# Patient Record
Sex: Female | Born: 1960 | Race: White | Hispanic: No | Marital: Single | Smoking: Current every day smoker
Health system: Southern US, Community
[De-identification: ages and names within clinical notes are randomized; demographics above are authoritative.]

## PROBLEM LIST (undated history)

## (undated) DIAGNOSIS — N281 Cyst of kidney, acquired: Secondary | ICD-10-CM

## (undated) DIAGNOSIS — E119 Type 2 diabetes mellitus without complications: Secondary | ICD-10-CM

## (undated) DIAGNOSIS — I10 Essential (primary) hypertension: Secondary | ICD-10-CM

## (undated) DIAGNOSIS — E785 Hyperlipidemia, unspecified: Secondary | ICD-10-CM

## (undated) HISTORY — DX: Cyst of kidney, acquired: N28.1

## (undated) HISTORY — DX: Type 2 diabetes mellitus without complications: E11.9

## (undated) HISTORY — DX: Hyperlipidemia, unspecified: E78.5

## (undated) HISTORY — DX: Essential (primary) hypertension: I10

## (undated) HISTORY — PX: ANKLE SURGERY: SHX546

---

## 2007-10-16 ENCOUNTER — Emergency Department (HOSPITAL_COMMUNITY): Admission: EM | Admit: 2007-10-16 | Discharge: 2007-10-17 | Payer: Self-pay | Admitting: Emergency Medicine

## 2008-03-25 ENCOUNTER — Emergency Department (HOSPITAL_BASED_OUTPATIENT_CLINIC_OR_DEPARTMENT_OTHER): Admission: EM | Admit: 2008-03-25 | Discharge: 2008-03-25 | Payer: Self-pay | Admitting: Emergency Medicine

## 2009-04-26 ENCOUNTER — Encounter: Admission: RE | Admit: 2009-04-26 | Discharge: 2009-04-26 | Payer: Self-pay | Admitting: Obstetrics & Gynecology

## 2009-10-02 ENCOUNTER — Ambulatory Visit: Payer: Self-pay | Admitting: Genetic Counselor

## 2011-03-25 LAB — DIFFERENTIAL
Basophils Absolute: 0
Eosinophils Relative: 2
Lymphs Abs: 4
Monocytes Absolute: 0.4
Monocytes Relative: 4
Neutro Abs: 5.1

## 2011-03-25 LAB — URINE MICROSCOPIC-ADD ON

## 2011-03-25 LAB — CBC
HCT: 39.1
Hemoglobin: 13.7
RDW: 12.1
WBC: 9.7

## 2011-03-25 LAB — URINALYSIS, ROUTINE W REFLEX MICROSCOPIC
Bilirubin Urine: NEGATIVE
Glucose, UA: NEGATIVE
Ketones, ur: NEGATIVE
Nitrite: NEGATIVE
Urobilinogen, UA: 0.2
pH: 6

## 2011-03-25 LAB — URINE CULTURE: Culture: NO GROWTH

## 2011-03-25 LAB — BASIC METABOLIC PANEL
CO2: 22
Calcium: 9.7
Chloride: 109
Creatinine, Ser: 0.66
Potassium: 3.6
Sodium: 141

## 2011-03-31 LAB — GLUCOSE, CAPILLARY

## 2011-04-24 ENCOUNTER — Other Ambulatory Visit: Payer: Self-pay | Admitting: Obstetrics & Gynecology

## 2011-04-24 DIAGNOSIS — Z1231 Encounter for screening mammogram for malignant neoplasm of breast: Secondary | ICD-10-CM

## 2011-05-19 ENCOUNTER — Ambulatory Visit
Admission: RE | Admit: 2011-05-19 | Discharge: 2011-05-19 | Disposition: A | Discharge status: Home / Self Care | Payer: BC Managed Care – PPO | Source: Ambulatory Visit | Attending: Obstetrics & Gynecology | Admitting: Obstetrics & Gynecology

## 2011-05-19 DIAGNOSIS — Z1231 Encounter for screening mammogram for malignant neoplasm of breast: Secondary | ICD-10-CM

## 2013-10-18 ENCOUNTER — Encounter: Payer: Self-pay | Admitting: Internal Medicine

## 2014-04-06 ENCOUNTER — Other Ambulatory Visit: Payer: Self-pay | Admitting: Internal Medicine

## 2014-04-06 ENCOUNTER — Encounter: Payer: Self-pay | Admitting: Internal Medicine

## 2014-04-06 DIAGNOSIS — Z1231 Encounter for screening mammogram for malignant neoplasm of breast: Secondary | ICD-10-CM

## 2014-04-10 ENCOUNTER — Telehealth: Payer: Self-pay

## 2014-04-10 NOTE — Telephone Encounter (Signed)
Left message with patient regarding appt with Dr. Delsa Sale on 12/28 at 2:50pm

## 2014-05-16 ENCOUNTER — Ambulatory Visit: Payer: Self-pay

## 2014-05-29 ENCOUNTER — Ambulatory Visit (AMBULATORY_SURGERY_CENTER): Payer: Self-pay | Admitting: *Deleted

## 2014-05-29 VITALS — Ht 68.5 in | Wt 209.0 lb

## 2014-05-29 DIAGNOSIS — Z800 Family history of malignant neoplasm of digestive organs: Secondary | ICD-10-CM

## 2014-05-29 MED ORDER — MOVIPREP 100 G PO SOLR: 1.0000 | Freq: Once | ORAL | Status: DC

## 2014-05-29 NOTE — Progress Notes (Signed)
No egg or soy allergy. ewm No diet pills. ewm No blood thinners. ewm No issues with past sedation. ewm No home 02 use. ewm No emmi video. ewm

## 2014-06-12 ENCOUNTER — Encounter: Payer: Self-pay | Admitting: Internal Medicine

## 2014-06-26 ENCOUNTER — Ambulatory Visit: Payer: BC Managed Care – PPO | Admitting: Obstetrics & Gynecology

## 2014-06-27 ENCOUNTER — Encounter: Payer: Self-pay | Admitting: Obstetrics & Gynecology

## 2014-07-06 ENCOUNTER — Telehealth: Payer: Self-pay | Admitting: Internal Medicine

## 2014-07-06 NOTE — Telephone Encounter (Signed)
Left message for pt to call back.  Pt states she is to take her last prednisone pill on Monday and wanted to know if she should take it or not. Instructed her to hold it until after procedure due to not having food on her stomach.

## 2014-07-10 ENCOUNTER — Encounter: Payer: Self-pay | Admitting: Internal Medicine

## 2014-07-10 ENCOUNTER — Ambulatory Visit (AMBULATORY_SURGERY_CENTER): Payer: BLUE CROSS/BLUE SHIELD | Admitting: Internal Medicine

## 2014-07-10 DIAGNOSIS — D125 Benign neoplasm of sigmoid colon: Secondary | ICD-10-CM

## 2014-07-10 DIAGNOSIS — D123 Benign neoplasm of transverse colon: Secondary | ICD-10-CM

## 2014-07-10 DIAGNOSIS — Z1211 Encounter for screening for malignant neoplasm of colon: Secondary | ICD-10-CM

## 2014-07-10 MED ORDER — SODIUM CHLORIDE 0.9 % IV SOLN: 500.0000 mL | INTRAVENOUS | Status: DC

## 2014-07-10 NOTE — Op Note (Signed)
[place]  [address] [place], [id number]   COLONOSCOPY PROCEDURE REPORT  PATIENT: Marcia Reyes, Marcia Reyes  MR#: [id number] BIRTHDATE: 26-Oct-1960 , 88  yrs. old GENDER: female ENDOSCOPIST: Jerene Bears, MD REFERRED XT:[id number] Philip Aspen, M.D. PROCEDURE DATE:  07/10/2014 PROCEDURE:   Colonoscopy with snare polypectomy First Screening Colonoscopy - Avg.  risk and is 50 yrs.  old or older Yes.  Prior Negative Screening - Now for repeat screening. N/A  History of Adenoma - Now for follow-up colonoscopy & has been > or = to 3 yrs.  N/A  Polyps Removed Today? Yes. ASA CLASS:   Class II INDICATIONS:average risk for colorectal cancer and first colonoscopy. MEDICATIONS: Monitored anesthesia care and Propofol 300 mg IV  DESCRIPTION OF PROCEDURE:   After the risks benefits and alternatives of the procedure were thoroughly explained, informed consent was obtained.  The digital rectal exam revealed external hemorrhoids.   The LB [id number]  endoscope was introduced through the anus and advanced to the cecum, which was identified by both the appendix and ileocecal valve. No adverse events experienced.   The quality of the prep was good, using MoviPrep The instrument was then slowly withdrawn as the colon was fully examined.   COLON FINDINGS: Three sessile polyps ranging from 4 to 12mm in size were found in the transverse colon and sigmoid colon. Polypectomies were performed with a cold snare.  The resection was complete, the polyp tissue was completely retrieved and sent to histology.   There was mild diverticulosis noted at the cecum and in the sigmoid colon.  Retroflexed views revealed internal hemorrhoids and Retroflexed views revealed external hemorrhoids. The time to cecum=3 minutes 42 seconds.  Withdrawal time=12 minutes 26 seconds.  The scope was withdrawn and the procedure completed. COMPLICATIONS: There were no immediate complications.  ENDOSCOPIC IMPRESSION: 1.    Three sessile polyps ranging from 4 to 67mm in size were found in the transverse colon and sigmoid colon; polypectomies were performed with a cold snare 2.   Mild diverticulosis was noted at the cecum and in the sigmoid colon  RECOMMENDATIONS: 1.  Await pathology results 2.  High fiber diet 3.  Timing of repeat colonoscopy will be determined by pathology findings. 4.  You will receive a letter within 1-2 weeks with the results of your biopsy as well as final recommendations.  Please call my office if you have not received a letter after 3 weeks.  eSigned:  Jerene Bears, MD 07/10/2014 3:30 PM  cc: Leanna Battles, MD and The Patient

## 2014-07-10 NOTE — Progress Notes (Signed)
Called to room to assist during endoscopic procedure.  Patient ID and intended procedure confirmed with present staff. Received instructions for my participation in the procedure from the performing physician.  

## 2014-07-10 NOTE — Patient Instructions (Signed)
YOU HAD AN ENDOSCOPIC PROCEDURE TODAY AT THE [facility]: Refer to the procedure report that was given to you for any specific questions about what was found during the examination.  If the procedure report does not answer your questions, please call your gastroenterologist to clarify.  If you requested that your care partner not be given the details of your procedure findings, then the procedure report has been included in a sealed envelope for you to review at your convenience later.  YOU SHOULD EXPECT: Some feelings of bloating in the abdomen. Passage of more gas than usual.  Walking can help get rid of the air that was put into your GI tract during the procedure and reduce the bloating. If you had a lower endoscopy (such as a colonoscopy or flexible sigmoidoscopy) you may notice spotting of blood in your stool or on the toilet paper. If you underwent a bowel prep for your procedure, then you may not have a normal bowel movement for a few days.  DIET: Your first meal following the procedure should be a light meal and then it is ok to progress to your normal diet.  A half-sandwich or bowl of soup is an example of a good first meal.  Heavy or fried foods are harder to digest and may make you feel nauseous or bloated.  Likewise meals heavy in dairy and vegetables can cause extra gas to form and this can also increase the bloating.  Drink plenty of fluids but you should avoid alcoholic beverages for 24 hours.  ACTIVITY: Your care partner should take you home directly after the procedure.  You should plan to take it easy, moving slowly for the rest of the day.  You can resume normal activity the day after the procedure however you should NOT DRIVE or use heavy machinery for 24 hours (because of the sedation medicines used during the test).    SYMPTOMS TO REPORT IMMEDIATELY: A gastroenterologist can be reached at any hour.  During normal business hours, 8:30 AM to 5:00 PM Monday through Friday,  call [phone].  After hours and on weekends, please call the GI answering service at [phone] who will take a message and have the physician on call contact you.   Following lower endoscopy (colonoscopy or flexible sigmoidoscopy):  Excessive amounts of blood in the stool  Significant tenderness or worsening of abdominal pains  Swelling of the abdomen that is new, acute  Fever of 100F or higher  FOLLOW UP: If any biopsies were taken you will be contacted by phone or by letter within the next 1-3 weeks.  Call your gastroenterologist if you have not heard about the biopsies in 3 weeks.  Our staff will call the home number listed on your records the next business day following your procedure to check on you and address any questions or concerns that you may have at that time regarding the information given to you following your procedure. This is a courtesy call and so if there is no answer at the home number and we have not heard from you through the emergency physician on call, we will assume that you have returned to your regular daily activities without incident.  SIGNATURES/CONFIDENTIALITY: You and/or your care partner have signed paperwork which will be entered into your electronic medical record.  These signatures attest to the fact that that the information above on your After Visit Summary has been reviewed and is understood.  Full responsibility of the confidentiality of this   discharge information lies with you and/or your care-partner.  Recommendations Next colonoscopy determined by pathology results. Polyp, diverticulosis, hemorrhoid, and high fiber diet handouts provided to patient/care partner.

## 2014-07-10 NOTE — Progress Notes (Signed)
Report to PACU, RN, vss, BBS= Clear.  

## 2014-07-11 ENCOUNTER — Telehealth: Payer: Self-pay | Admitting: *Deleted

## 2014-07-11 NOTE — Telephone Encounter (Signed)
  Follow up Call-  Call back number 07/10/2014  Post procedure Call Back phone  # [phone]  Permission to leave phone message Yes     Patient questions:  Do you have a fever, pain , or abdominal swelling? No. Pain Score  0 *  Have you tolerated food without any problems? Yes.    Have you been able to return to your normal activities? Yes.    Do you have any questions about your discharge instructions: Diet   No. Medications  No. Follow up visit  No.  Do you have questions or concerns about your Care? No.  Actions: * If pain score is 4 or above: No action needed, pain <4.

## 2014-07-20 ENCOUNTER — Encounter: Payer: Self-pay | Admitting: Internal Medicine

## 2019-06-21 ENCOUNTER — Encounter: Payer: Self-pay | Admitting: *Deleted

## 2020-04-06 ENCOUNTER — Other Ambulatory Visit: Payer: Self-pay | Admitting: Internal Medicine

## 2020-04-06 DIAGNOSIS — Z720 Tobacco use: Secondary | ICD-10-CM

## 2020-04-06 DIAGNOSIS — E1121 Type 2 diabetes mellitus with diabetic nephropathy: Secondary | ICD-10-CM

## 2020-04-25 ENCOUNTER — Ambulatory Visit
Admission: RE | Admit: 2020-04-25 | Discharge: 2020-04-25 | Disposition: A | Discharge status: Home / Self Care | Payer: No Typology Code available for payment source | Source: Ambulatory Visit | Attending: Internal Medicine | Admitting: Internal Medicine

## 2020-04-25 ENCOUNTER — Other Ambulatory Visit: Payer: Self-pay

## 2020-04-25 ENCOUNTER — Other Ambulatory Visit: Payer: Self-pay | Admitting: Internal Medicine

## 2020-04-25 DIAGNOSIS — E1121 Type 2 diabetes mellitus with diabetic nephropathy: Secondary | ICD-10-CM

## 2020-04-25 DIAGNOSIS — Z720 Tobacco use: Secondary | ICD-10-CM

## 2022-01-12 IMAGING — CT CT CHEST LUNG CANCER SCREENING LOW DOSE W/O CM
1 series · 14 of 33 positions shown, 18 images · non-contrast
Comparison: No priors.

CLINICAL DATA: 58-year-old female current smoker with 46 pack-year
history of smoking. Lung cancer screening examination.

EXAM:
CT CHEST WITHOUT CONTRAST LOW-DOSE FOR LUNG CANCER SCREENING
TECHNIQUE: Multidetector CT imaging of the chest was performed following the
standard protocol without IV contrast.

[Series 2: ldct screening <30 bmi · axial · 0.71mm/px · z∈[-324,-34]mm · 14 of 70 slices shown, 18 images]
[im 6/70  mediastinal]
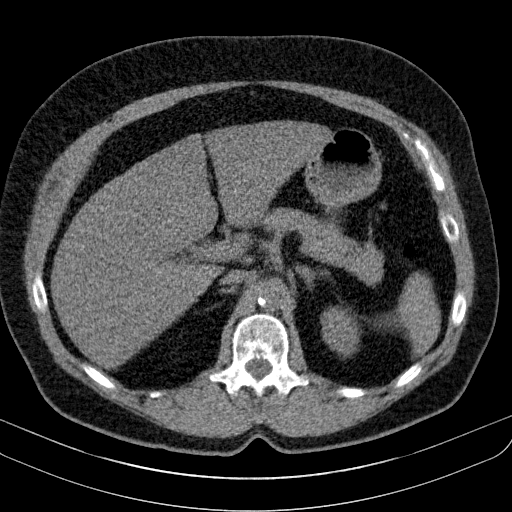
[im 6/70  lung]
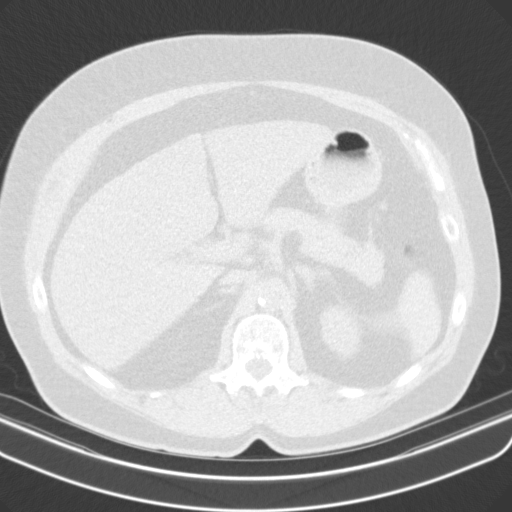
[im 11/70  lung]
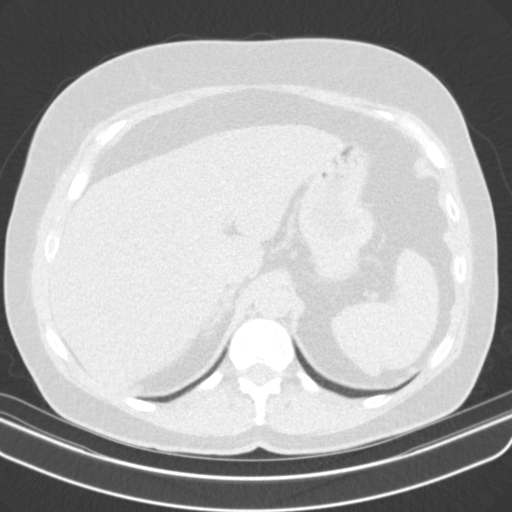
[im 14/70  lung]
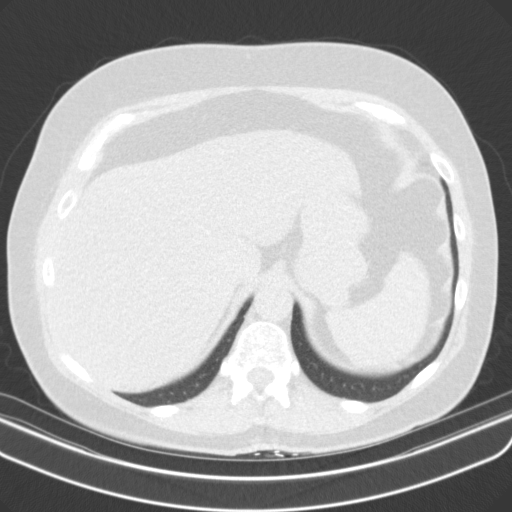
[im 18/70  lung]
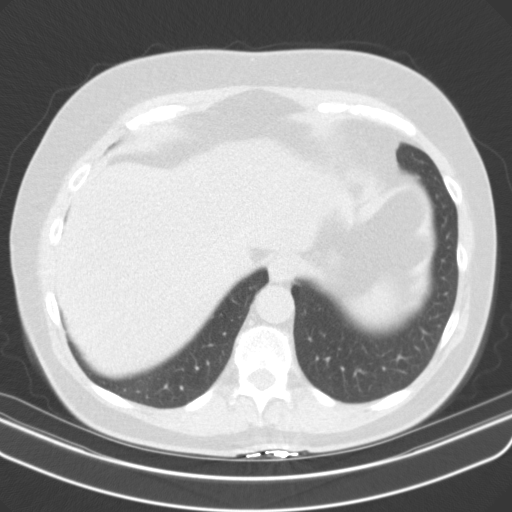
[im 24/70  mediastinal]
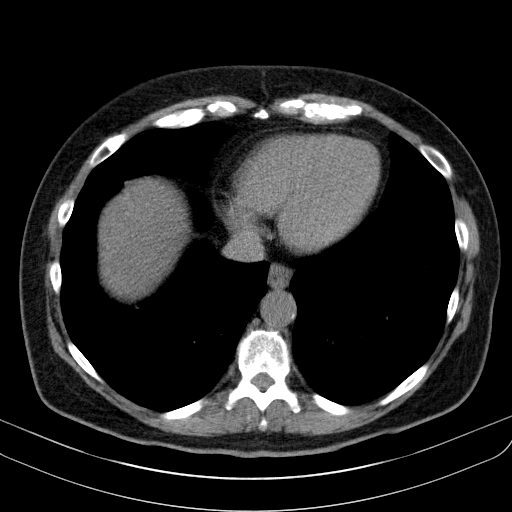
[im 24/70  lung]
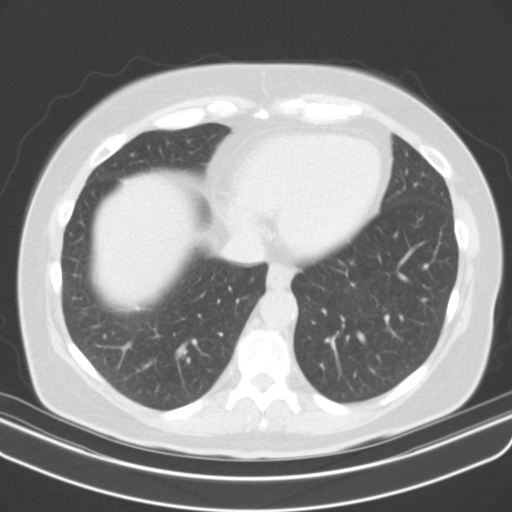
[im 29/70  lung]
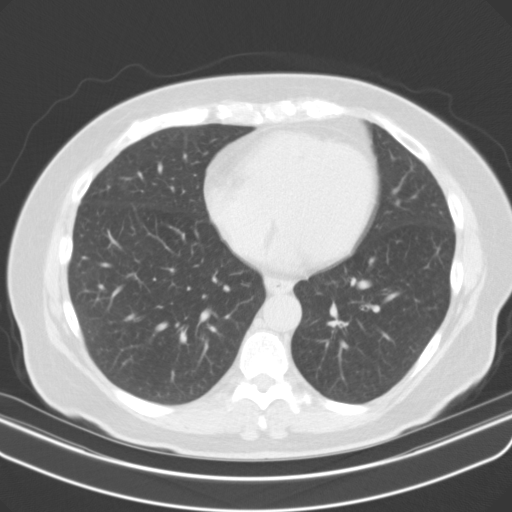
[im 34/70  lung]
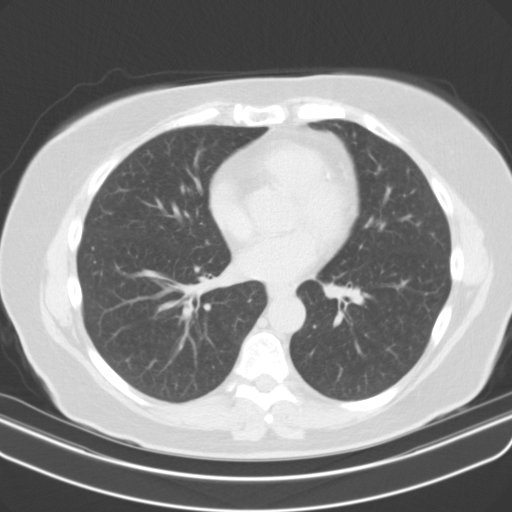
[im 37/70  lung]
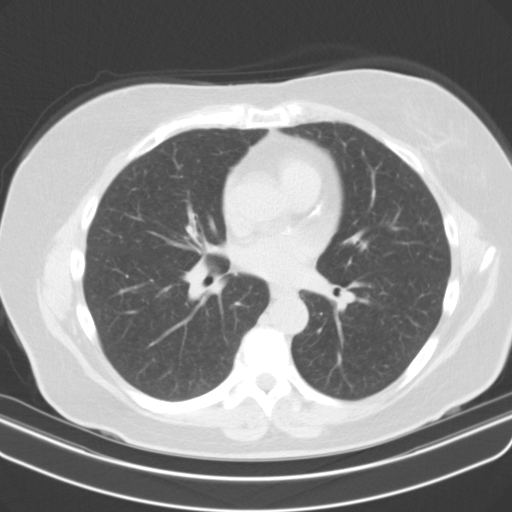
[im 41/70  mediastinal]
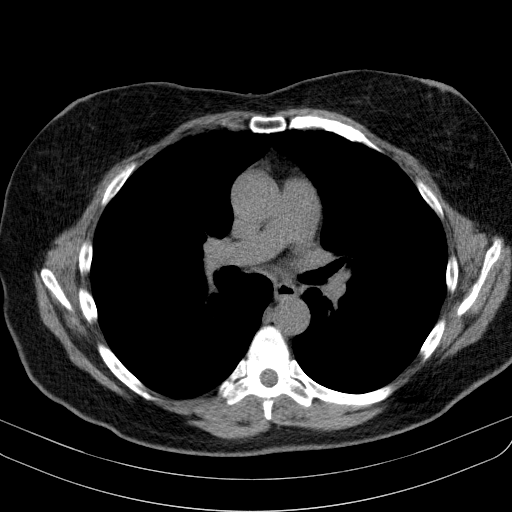
[im 41/70  lung]
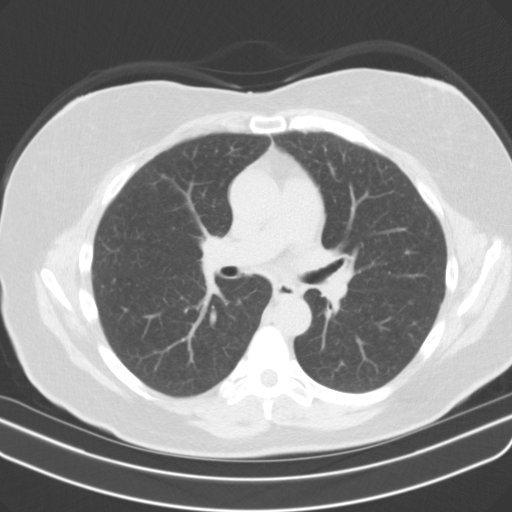
[im 47/70  lung]
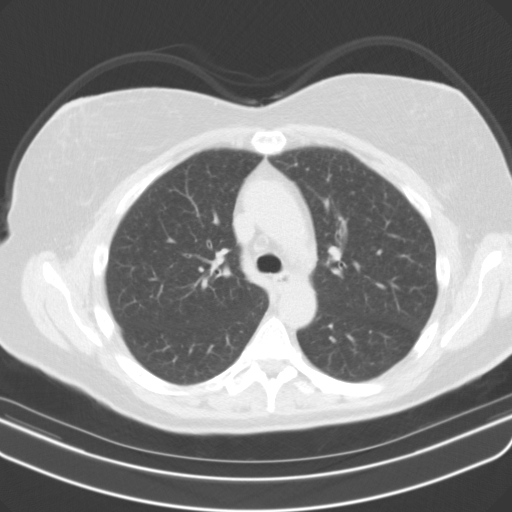
[im 52/70  lung]
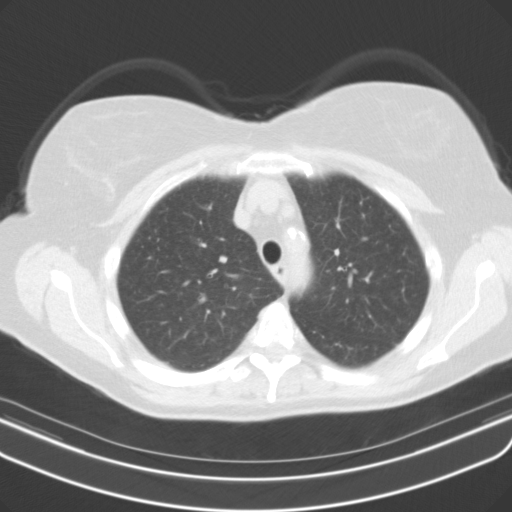
[im 56/70  lung]
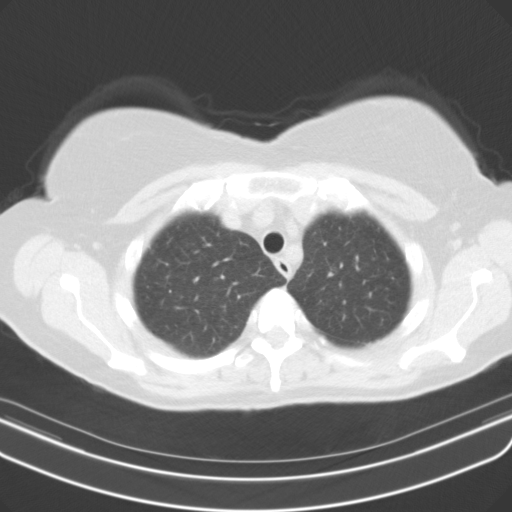
[im 59/70  mediastinal]
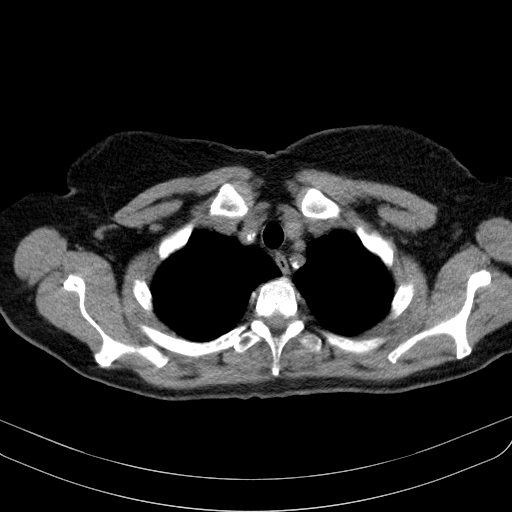
[im 59/70  lung]
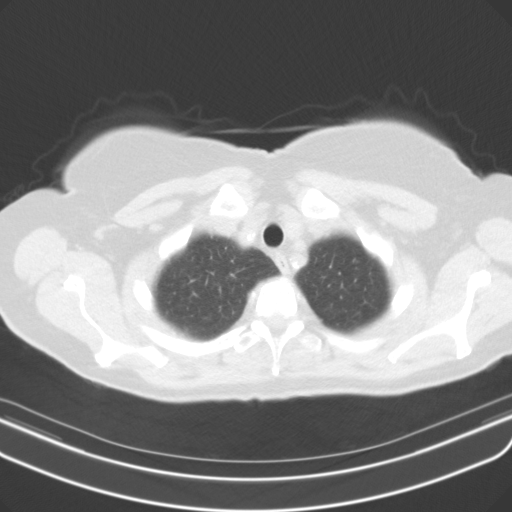
[im 64/70  lung]
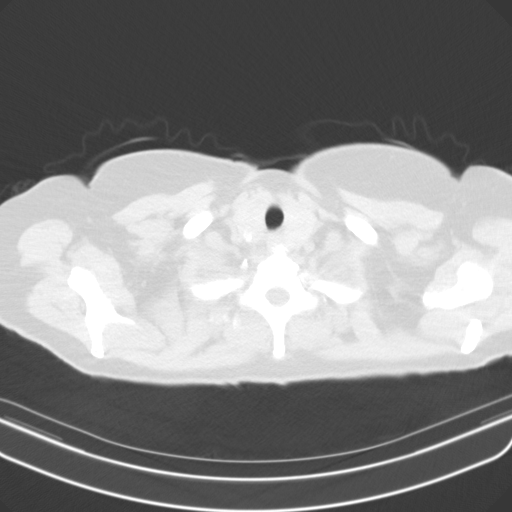

[14 of 33 positions shown; findings below may reference images not displayed]

FINDINGS: Cardiovascular: Heart size is normal. There is no significant
pericardial fluid, thickening or pericardial calcification. There is
aortic atherosclerosis, as well as atherosclerosis of the great
vessels of the mediastinum and the coronary arteries, including
calcified atherosclerotic plaque in the left main and left anterior
descending coronary arteries.

Mediastinum/Nodes: No pathologically enlarged mediastinal or hilar
lymph nodes. Please note that accurate exclusion of hilar adenopathy
is limited on noncontrast CT scans. Esophagus is unremarkable in
appearance. No axillary lymphadenopathy.

Lungs/Pleura: Multiple small pulmonary nodules are noted throughout
the lungs bilaterally, largest of which is in the anterior aspect of
the right upper lobe (axial image 100 of series 3), with a volume
derived mean diameter of 2.9 mm. No other larger more suspicious
appearing pulmonary nodules or masses are noted. No acute
consolidative airspace disease. No pleural effusions. Diffuse
bronchial wall thickening with mild centrilobular and paraseptal
emphysema.

Upper Abdomen: Diffuse low attenuation throughout the visualized
hepatic parenchyma, indicative of hepatic steatosis. Aortic
atherosclerosis.

Musculoskeletal: There are no aggressive appearing lytic or blastic
lesions noted in the visualized portions of the skeleton.
IMPRESSION: 1. Lung-RADS 2S, benign appearance or behavior. Continue annual
screening with low-dose chest CT without contrast in 12 months.
2. The "S" modifier above refers to potentially clinically
significant non lung cancer related findings. Specifically, there is
aortic atherosclerosis, in addition to left main and left anterior
descending coronary artery disease. Please note that although the
presence of coronary artery calcium documents the presence of
coronary artery disease, the severity of this disease and any
potential stenosis cannot be assessed on this non-gated CT
examination. Assessment for potential risk factor modification,
dietary therapy or pharmacologic therapy may be warranted, if
clinically indicated.
3. Mild diffuse bronchial wall thickening with mild centrilobular
and paraseptal emphysema; imaging findings suggestive of underlying
COPD.
4. Hepatic steatosis.

Aortic Atherosclerosis (GPS6V-I9J.J) and Emphysema (GPS6V-K79.3).

## 2022-04-29 ENCOUNTER — Other Ambulatory Visit: Payer: Self-pay | Admitting: Internal Medicine

## 2022-04-29 DIAGNOSIS — Z720 Tobacco use: Secondary | ICD-10-CM

## 2022-04-29 DIAGNOSIS — J3489 Other specified disorders of nose and nasal sinuses: Secondary | ICD-10-CM

## 2022-06-04 ENCOUNTER — Encounter: Payer: Self-pay | Admitting: Internal Medicine

## 2022-06-05 ENCOUNTER — Other Ambulatory Visit: Payer: Self-pay | Admitting: Internal Medicine

## 2022-06-05 ENCOUNTER — Ambulatory Visit
Admission: RE | Admit: 2022-06-05 | Discharge: 2022-06-05 | Disposition: A | Discharge status: Home / Self Care | Payer: No Typology Code available for payment source | Source: Ambulatory Visit | Attending: Internal Medicine | Admitting: Internal Medicine

## 2022-06-05 DIAGNOSIS — Z720 Tobacco use: Secondary | ICD-10-CM

## 2022-06-05 DIAGNOSIS — J3489 Other specified disorders of nose and nasal sinuses: Secondary | ICD-10-CM

## 2023-07-16 ENCOUNTER — Other Ambulatory Visit: Payer: Self-pay | Admitting: Internal Medicine

## 2023-07-16 DIAGNOSIS — Z720 Tobacco use: Secondary | ICD-10-CM
